# Patient Record
Sex: Female | Born: 1965 | Race: White | Hispanic: No | Marital: Married | Smoking: Never smoker
Health system: Southern US, Community
[De-identification: ages and names within clinical notes are randomized; demographics above are authoritative.]

## PROBLEM LIST (undated history)

## (undated) DIAGNOSIS — D759 Disease of blood and blood-forming organs, unspecified: Secondary | ICD-10-CM

## (undated) HISTORY — PX: TONSILLECTOMY: SUR1361

## (undated) HISTORY — PX: EYE SURGERY: SHX253

---

## 2006-08-11 ENCOUNTER — Other Ambulatory Visit: Admission: RE | Admit: 2006-08-11 | Discharge: 2006-08-11 | Payer: Self-pay | Admitting: Family Medicine

## 2006-08-26 ENCOUNTER — Encounter: Admission: RE | Admit: 2006-08-26 | Discharge: 2006-08-26 | Payer: Self-pay | Admitting: Family Medicine

## 2008-12-06 ENCOUNTER — Encounter: Admission: RE | Admit: 2008-12-06 | Discharge: 2008-12-06 | Payer: Self-pay | Admitting: Obstetrics and Gynecology

## 2009-02-07 ENCOUNTER — Inpatient Hospital Stay (HOSPITAL_COMMUNITY): Admission: AD | Admit: 2009-02-07 | Discharge: 2009-02-09 | Payer: Self-pay | Admitting: Obstetrics and Gynecology

## 2010-10-28 LAB — CBC
HCT: 23.5 % — ABNORMAL LOW (ref 36.0–46.0)
HCT: 29.4 % — ABNORMAL LOW (ref 36.0–46.0)
HCT: 29.9 % — ABNORMAL LOW (ref 36.0–46.0)
Hemoglobin: 7.9 g/dL — CL (ref 12.0–15.0)
Hemoglobin: 9.8 g/dL — ABNORMAL LOW (ref 12.0–15.0)
MCHC: 33.4 g/dL (ref 30.0–36.0)
MCHC: 33.7 g/dL (ref 30.0–36.0)
MCV: 85 fL (ref 78.0–100.0)
Platelets: 202 10*3/uL (ref 150–400)
Platelets: 230 10*3/uL (ref 150–400)
RBC: 2.75 MIL/uL — ABNORMAL LOW (ref 3.87–5.11)
RBC: 3.49 MIL/uL — ABNORMAL LOW (ref 3.87–5.11)
RBC: 3.52 MIL/uL — ABNORMAL LOW (ref 3.87–5.11)
RDW: 13.8 % (ref 11.5–15.5)
RDW: 13.9 % (ref 11.5–15.5)
WBC: 11.8 10*3/uL — ABNORMAL HIGH (ref 4.0–10.5)
WBC: 7.3 10*3/uL (ref 4.0–10.5)

## 2010-10-28 LAB — GLUCOSE, CAPILLARY
Glucose-Capillary: 115 mg/dL — ABNORMAL HIGH (ref 70–99)
Glucose-Capillary: 122 mg/dL — ABNORMAL HIGH (ref 70–99)
Glucose-Capillary: 129 mg/dL — ABNORMAL HIGH (ref 70–99)

## 2013-09-01 ENCOUNTER — Encounter (HOSPITAL_COMMUNITY): Payer: Self-pay

## 2013-09-03 ENCOUNTER — Encounter (HOSPITAL_COMMUNITY): Payer: Self-pay | Admitting: Pharmacist

## 2013-09-13 ENCOUNTER — Encounter (HOSPITAL_COMMUNITY): Payer: Self-pay

## 2013-09-13 ENCOUNTER — Encounter (HOSPITAL_COMMUNITY)
Admission: RE | Admit: 2013-09-13 | Discharge: 2013-09-13 | Disposition: A | Discharge status: Home / Self Care | Payer: Managed Care, Other (non HMO) | Source: Ambulatory Visit | Attending: Obstetrics and Gynecology | Admitting: Obstetrics and Gynecology

## 2013-09-13 DIAGNOSIS — Z01812 Encounter for preprocedural laboratory examination: Secondary | ICD-10-CM | POA: Insufficient documentation

## 2013-09-13 LAB — CBC
HCT: 41.5 % (ref 36.0–46.0)
HEMOGLOBIN: 14.3 g/dL (ref 12.0–15.0)
MCH: 31.2 pg (ref 26.0–34.0)
MCHC: 34.5 g/dL (ref 30.0–36.0)
MCV: 90.6 fL (ref 78.0–100.0)
PLATELETS: 287 10*3/uL (ref 150–400)
RBC: 4.58 MIL/uL (ref 3.87–5.11)
RDW: 12.3 % (ref 11.5–15.5)
WBC: 5.6 10*3/uL (ref 4.0–10.5)

## 2013-09-13 NOTE — Patient Instructions (Signed)
[place]  09/13/2013   Your procedure is scheduled on:  09/20/13  Enter through the Main Entrance of [facility] up the phone at the desk and dial [phone].   Call this number if you have problems the morning of surgery: [phone]   Remember:   Do not eat food:After Midnight.  Do not drink clear liquids: After Midnight.  Take these medicines the morning of surgery with A SIP OF WATER: NA   Do not wear jewelry, make-up or nail polish.  Do not wear lotions, powders, or perfumes. You may wear deodorant.  Do not shave 24 hours prior to surgery.  Do not bring valuables to the hospital.  [facility] is not   responsible for any belongings or valuables brought to the hospital.  Contacts, dentures or bridgework may not be worn into surgery.  Leave suitcase in the car. After surgery it may be brought to your room.  For patients admitted to the hospital, checkout time is 11:00 AM the day of              discharge.   Patients discharged the day of surgery will not be allowed to drive             home.  Name and phone number of your driver: NA  Special Instructions:   Shower using CHG 2 nights before surgery and the night before surgery.  If you shower the day of surgery use CHG.  Use special wash - you have one bottle of CHG for all showers.  You should use approximately 1/3 of the bottle for each shower. Please read over the following fact sheets that you were given:   Surgical Site Infection Prevention

## 2013-09-14 NOTE — H&P (Signed)
  48 year old G 3 P 2 with symptomatic fibroids. She reports very heavy periods and has had anemia and low ferritin.  An ultrasound on 07/28/2013 revealed multiple intramural fibroids  The patient desires LAVH and BSO.  Medical History : Abnormal pap Asthma Anemia  Family History: Mom endometrial cancer  Surgical history : None  Meds Ranitidine Aleve prn Fish oil Iron  NKDA  Afebrile VSS General alert and oriented Lung CTAB Car RRR Abdomen is soft and non tender Pelvic Uterus is slightly enlarged, myomatous   IMPRESSION: Symptomatic Fibroids  PLAN: LAVH and BSO Risks reviewed with patient Consent is signed

## 2013-09-20 ENCOUNTER — Encounter (HOSPITAL_COMMUNITY): Payer: Managed Care, Other (non HMO) | Admitting: Anesthesiology

## 2013-09-20 ENCOUNTER — Observation Stay (HOSPITAL_COMMUNITY)
Admission: RE | Admit: 2013-09-20 | Discharge: 2013-09-21 | Disposition: A | Discharge status: Home / Self Care | Payer: Managed Care, Other (non HMO) | Source: Ambulatory Visit | Attending: Obstetrics and Gynecology | Admitting: Obstetrics and Gynecology

## 2013-09-20 ENCOUNTER — Encounter (HOSPITAL_COMMUNITY): Payer: Self-pay

## 2013-09-20 ENCOUNTER — Encounter (HOSPITAL_COMMUNITY)
Admission: RE | Disposition: A | Discharge status: Home / Self Care | Payer: Self-pay | Source: Ambulatory Visit | Attending: Obstetrics and Gynecology

## 2013-09-20 ENCOUNTER — Ambulatory Visit (HOSPITAL_COMMUNITY): Payer: Managed Care, Other (non HMO) | Admitting: Anesthesiology

## 2013-09-20 DIAGNOSIS — N841 Polyp of cervix uteri: Secondary | ICD-10-CM | POA: Insufficient documentation

## 2013-09-20 DIAGNOSIS — N852 Hypertrophy of uterus: Secondary | ICD-10-CM | POA: Insufficient documentation

## 2013-09-20 DIAGNOSIS — N831 Corpus luteum cyst of ovary, unspecified side: Secondary | ICD-10-CM | POA: Insufficient documentation

## 2013-09-20 DIAGNOSIS — N920 Excessive and frequent menstruation with regular cycle: Principal | ICD-10-CM | POA: Insufficient documentation

## 2013-09-20 DIAGNOSIS — D251 Intramural leiomyoma of uterus: Secondary | ICD-10-CM | POA: Insufficient documentation

## 2013-09-20 DIAGNOSIS — N8300 Follicular cyst of ovary, unspecified side: Secondary | ICD-10-CM | POA: Insufficient documentation

## 2013-09-20 DIAGNOSIS — Z90710 Acquired absence of both cervix and uterus: Secondary | ICD-10-CM | POA: Diagnosis present

## 2013-09-20 HISTORY — DX: Disease of blood and blood-forming organs, unspecified: D75.9

## 2013-09-20 HISTORY — PX: SALPINGOOPHORECTOMY: SHX82

## 2013-09-20 HISTORY — PX: LAPAROSCOPIC ASSISTED VAGINAL HYSTERECTOMY: SHX5398

## 2013-09-20 LAB — BASIC METABOLIC PANEL
BUN: 12 mg/dL (ref 6–23)
CALCIUM: 8.4 mg/dL (ref 8.4–10.5)
CO2: 24 meq/L (ref 19–32)
Chloride: 105 mEq/L (ref 96–112)
Creatinine, Ser: 0.67 mg/dL (ref 0.50–1.10)
GFR calc Af Amer: >90 mL/min (ref >90)
Glucose, Bld: 149 mg/dL — ABNORMAL HIGH (ref 70–99)
Potassium: 4.3 mEq/L (ref 3.7–5.3)
SODIUM: 140 meq/L (ref 137–147)

## 2013-09-20 SURGERY — HYSTERECTOMY, VAGINAL, LAPAROSCOPY-ASSISTED
Anesthesia: General | Site: Abdomen

## 2013-09-20 MED ORDER — ROCURONIUM BROMIDE 100 MG/10ML IV SOLN
INTRAVENOUS | Status: AC
  Filled 2013-09-20: qty 1

## 2013-09-20 MED ORDER — FENTANYL CITRATE 0.05 MG/ML IJ SOLN
INTRAMUSCULAR | Status: AC
  Administered 2013-09-20: 50 ug via INTRAVENOUS
  Filled 2013-09-20: qty 2

## 2013-09-20 MED ORDER — LIDOCAINE HCL (CARDIAC) 20 MG/ML IV SOLN
INTRAVENOUS | Status: DC | PRN
  Administered 2013-09-20: 80 mg via INTRAVENOUS

## 2013-09-20 MED ORDER — FLUTICASONE PROPIONATE 50 MCG/ACT NA SUSP
1.0000 | Freq: Every day | NASAL | Status: DC
  Administered 2013-09-21: 1 via NASAL
  Filled 2013-09-20: qty 16

## 2013-09-20 MED ORDER — LIDOCAINE HCL (CARDIAC) 20 MG/ML IV SOLN
INTRAVENOUS | Status: AC
  Filled 2013-09-20: qty 5

## 2013-09-20 MED ORDER — ROCURONIUM BROMIDE 100 MG/10ML IV SOLN
INTRAVENOUS | Status: DC | PRN
  Administered 2013-09-20: 5 mg via INTRAVENOUS
  Administered 2013-09-20: 45 mg via INTRAVENOUS

## 2013-09-20 MED ORDER — PROMETHAZINE HCL 25 MG/ML IJ SOLN
6.2500 mg | INTRAMUSCULAR | Status: AC | PRN
  Administered 2013-09-20 (×2): 12.5 mg via INTRAVENOUS

## 2013-09-20 MED ORDER — DEXAMETHASONE SODIUM PHOSPHATE 10 MG/ML IJ SOLN
INTRAMUSCULAR | Status: AC
  Filled 2013-09-20: qty 1

## 2013-09-20 MED ORDER — KETOROLAC TROMETHAMINE 30 MG/ML IJ SOLN: 15.0000 mg | Freq: Once | INTRAMUSCULAR | Status: DC | PRN

## 2013-09-20 MED ORDER — FENTANYL CITRATE 0.05 MG/ML IJ SOLN
25.0000 ug | INTRAMUSCULAR | Status: DC | PRN
  Administered 2013-09-20 (×3): 50 ug via INTRAVENOUS

## 2013-09-20 MED ORDER — METOCLOPRAMIDE HCL 5 MG/ML IJ SOLN
10.0000 mg | Freq: Once | INTRAMUSCULAR | Status: AC
  Administered 2013-09-20: 10 mg via INTRAVENOUS

## 2013-09-20 MED ORDER — IBUPROFEN 600 MG PO TABS
600.0000 mg | ORAL_TABLET | Freq: Four times a day (QID) | ORAL | Status: DC | PRN
  Administered 2013-09-21 (×3): 600 mg via ORAL
  Filled 2013-09-20 (×3): qty 1

## 2013-09-20 MED ORDER — GLYCOPYRROLATE 0.2 MG/ML IJ SOLN
INTRAMUSCULAR | Status: AC
  Filled 2013-09-20: qty 3

## 2013-09-20 MED ORDER — MEPERIDINE HCL 25 MG/ML IJ SOLN: 6.2500 mg | INTRAMUSCULAR | Status: DC | PRN

## 2013-09-20 MED ORDER — ONDANSETRON HCL 4 MG/2ML IJ SOLN
INTRAMUSCULAR | Status: AC
  Administered 2013-09-20: 4 mg via INTRAVENOUS
  Filled 2013-09-20: qty 2

## 2013-09-20 MED ORDER — DEXAMETHASONE SODIUM PHOSPHATE 10 MG/ML IJ SOLN
INTRAMUSCULAR | Status: DC | PRN
  Administered 2013-09-20: 10 mg via INTRAVENOUS

## 2013-09-20 MED ORDER — PROPOFOL 10 MG/ML IV BOLUS
INTRAVENOUS | Status: DC | PRN
  Administered 2013-09-20: 150 mg via INTRAVENOUS
  Administered 2013-09-20: 50 mg via INTRAVENOUS

## 2013-09-20 MED ORDER — DIPHENHYDRAMINE HCL 50 MG/ML IJ SOLN: 12.5000 mg | Freq: Four times a day (QID) | INTRAMUSCULAR | Status: DC | PRN

## 2013-09-20 MED ORDER — LACTATED RINGERS IR SOLN
Status: DC | PRN
  Administered 2013-09-20: 3000 mL

## 2013-09-20 MED ORDER — CEFAZOLIN SODIUM-DEXTROSE 2-3 GM-% IV SOLR
INTRAVENOUS | Status: AC
  Administered 2013-09-20: 2 g via INTRAVENOUS
  Filled 2013-09-20: qty 50

## 2013-09-20 MED ORDER — SODIUM CHLORIDE 0.9 % IJ SOLN
INTRAMUSCULAR | Status: DC | PRN
  Administered 2013-09-20: 3 mL via INTRAVENOUS

## 2013-09-20 MED ORDER — SCOPOLAMINE 1 MG/3DAYS TD PT72
MEDICATED_PATCH | TRANSDERMAL | Status: AC
  Administered 2013-09-20: 1.5 mg via TRANSDERMAL
  Filled 2013-09-20: qty 1

## 2013-09-20 MED ORDER — GLYCOPYRROLATE 0.2 MG/ML IJ SOLN
INTRAMUSCULAR | Status: DC | PRN
  Administered 2013-09-20: .5 mg via INTRAVENOUS

## 2013-09-20 MED ORDER — LORATADINE 10 MG PO TABS
10.0000 mg | ORAL_TABLET | Freq: Every day | ORAL | Status: DC | PRN
  Filled 2013-09-20: qty 1

## 2013-09-20 MED ORDER — MENTHOL 3 MG MT LOZG
1.0000 | LOZENGE | OROMUCOSAL | Status: DC | PRN
  Administered 2013-09-20: 3 mg via ORAL
  Filled 2013-09-20: qty 9

## 2013-09-20 MED ORDER — DIPHENHYDRAMINE HCL 12.5 MG/5ML PO ELIX: 12.5000 mg | ORAL_SOLUTION | Freq: Four times a day (QID) | ORAL | Status: DC | PRN

## 2013-09-20 MED ORDER — ONDANSETRON HCL 4 MG/2ML IJ SOLN
4.0000 mg | Freq: Once | INTRAMUSCULAR | Status: AC | PRN
  Administered 2013-09-20: 4 mg via INTRAVENOUS

## 2013-09-20 MED ORDER — NEOSTIGMINE METHYLSULFATE 1 MG/ML IJ SOLN
INTRAMUSCULAR | Status: DC | PRN
  Administered 2013-09-20: 3 mg via INTRAVENOUS

## 2013-09-20 MED ORDER — NEOSTIGMINE METHYLSULFATE 1 MG/ML IJ SOLN
INTRAMUSCULAR | Status: AC
  Filled 2013-09-20: qty 1

## 2013-09-20 MED ORDER — ONDANSETRON HCL 4 MG/2ML IJ SOLN
INTRAMUSCULAR | Status: AC
  Filled 2013-09-20: qty 2

## 2013-09-20 MED ORDER — PROPOFOL 10 MG/ML IV EMUL
INTRAVENOUS | Status: AC
  Filled 2013-09-20: qty 20

## 2013-09-20 MED ORDER — SODIUM CHLORIDE 0.9 % IJ SOLN: 9.0000 mL | INTRAMUSCULAR | Status: DC | PRN

## 2013-09-20 MED ORDER — MIDAZOLAM HCL 2 MG/2ML IJ SOLN
INTRAMUSCULAR | Status: AC
  Filled 2013-09-20: qty 2

## 2013-09-20 MED ORDER — BUPIVACAINE HCL (PF) 0.25 % IJ SOLN
INTRAMUSCULAR | Status: AC
  Filled 2013-09-20: qty 30

## 2013-09-20 MED ORDER — BUPIVACAINE HCL (PF) 0.25 % IJ SOLN
INTRAMUSCULAR | Status: DC | PRN
  Administered 2013-09-20: 6 mL

## 2013-09-20 MED ORDER — ESTRADIOL 0.1 MG/GM VA CREA
TOPICAL_CREAM | VAGINAL | Status: AC
  Filled 2013-09-20: qty 42.5

## 2013-09-20 MED ORDER — MIDAZOLAM HCL 2 MG/2ML IJ SOLN
INTRAMUSCULAR | Status: DC | PRN
  Administered 2013-09-20: 2 mg via INTRAVENOUS

## 2013-09-20 MED ORDER — SODIUM CHLORIDE 0.9 % IJ SOLN
INTRAMUSCULAR | Status: AC
  Filled 2013-09-20: qty 10

## 2013-09-20 MED ORDER — TRAMADOL HCL 50 MG PO TABS
50.0000 mg | ORAL_TABLET | Freq: Four times a day (QID) | ORAL | Status: DC | PRN
  Administered 2013-09-20 – 2013-09-21 (×3): 50 mg via ORAL
  Filled 2013-09-20 (×3): qty 1

## 2013-09-20 MED ORDER — ONDANSETRON HCL 4 MG/2ML IJ SOLN
INTRAMUSCULAR | Status: DC | PRN
  Administered 2013-09-20: 4 mg via INTRAVENOUS

## 2013-09-20 MED ORDER — PROMETHAZINE HCL 25 MG/ML IJ SOLN
INTRAMUSCULAR | Status: AC
Start: 2013-09-20 — End: 2013-09-20
  Administered 2013-09-20: 12.5 mg via INTRAVENOUS
  Filled 2013-09-20: qty 1

## 2013-09-20 MED ORDER — CEFAZOLIN SODIUM-DEXTROSE 2-3 GM-% IV SOLR: 2.0000 g | INTRAVENOUS | Status: DC

## 2013-09-20 MED ORDER — LACTATED RINGERS IV SOLN
INTRAVENOUS | Status: DC
  Administered 2013-09-20: 10:00:00 via INTRAVENOUS
  Administered 2013-09-20: 125 mL/h via INTRAVENOUS
  Administered 2013-09-20: 08:00:00 via INTRAVENOUS

## 2013-09-20 MED ORDER — FENTANYL CITRATE 0.05 MG/ML IJ SOLN
INTRAMUSCULAR | Status: DC | PRN
  Administered 2013-09-20: 100 ug via INTRAVENOUS
  Administered 2013-09-20 (×3): 50 ug via INTRAVENOUS

## 2013-09-20 MED ORDER — METOCLOPRAMIDE HCL 5 MG/ML IJ SOLN
INTRAMUSCULAR | Status: AC
  Filled 2013-09-20: qty 2

## 2013-09-20 MED ORDER — LIDOCAINE 1%/NA BICARB 0.1 MEQ INJECTION
INJECTION | INTRAVENOUS | Status: AC
  Filled 2013-09-20: qty 1

## 2013-09-20 MED ORDER — DEXTROSE IN LACTATED RINGERS 5 % IV SOLN
INTRAVENOUS | Status: DC
  Administered 2013-09-20: 14:00:00 via INTRAVENOUS

## 2013-09-20 MED ORDER — HYDROMORPHONE 0.3 MG/ML IV SOLN
INTRAVENOUS | Status: DC
  Administered 2013-09-20: 3 mL via INTRAVENOUS
  Administered 2013-09-20: 11:00:00 via INTRAVENOUS
  Administered 2013-09-20: 3.59 mg via INTRAVENOUS
  Administered 2013-09-20: 6 mL via INTRAVENOUS
  Filled 2013-09-20: qty 25

## 2013-09-20 MED ORDER — NALOXONE HCL 0.4 MG/ML IJ SOLN: 0.4000 mg | INTRAMUSCULAR | Status: DC | PRN

## 2013-09-20 MED ORDER — FENTANYL CITRATE 0.05 MG/ML IJ SOLN
INTRAMUSCULAR | Status: AC
  Filled 2013-09-20: qty 5

## 2013-09-20 MED ORDER — SCOPOLAMINE 1 MG/3DAYS TD PT72
1.0000 | MEDICATED_PATCH | TRANSDERMAL | Status: DC
  Administered 2013-09-20: 1.5 mg via TRANSDERMAL

## 2013-09-20 MED ORDER — ONDANSETRON HCL 4 MG/2ML IJ SOLN
4.0000 mg | Freq: Four times a day (QID) | INTRAMUSCULAR | Status: DC | PRN
  Administered 2013-09-20: 4 mg via INTRAVENOUS
  Filled 2013-09-20: qty 2

## 2013-09-20 SURGICAL SUPPLY — 39 items
ADH SKN CLS APL DERMABOND .7 (GAUZE/BANDAGES/DRESSINGS) ×2
BARRIER ADHS 3X4 INTERCEED (GAUZE/BANDAGES/DRESSINGS) IMPLANT
BRR ADH 4X3 ABS CNTRL BYND (GAUZE/BANDAGES/DRESSINGS)
CABLE HIGH FREQUENCY MONO STRZ (ELECTRODE) IMPLANT
CHLORAPREP W/TINT 26ML (MISCELLANEOUS) ×4 IMPLANT
CLOTH BEACON ORANGE TIMEOUT ST (SAFETY) ×4 IMPLANT
CONT PATH 16OZ SNAP LID 3702 (MISCELLANEOUS) ×4 IMPLANT
COVER TABLE BACK 60X90 (DRAPES) ×4 IMPLANT
DECANTER SPIKE VIAL GLASS SM (MISCELLANEOUS) IMPLANT
DERMABOND ADVANCED (GAUZE/BANDAGES/DRESSINGS) ×2
DERMABOND ADVANCED .7 DNX12 (GAUZE/BANDAGES/DRESSINGS) ×2 IMPLANT
ELECT REM PT RETURN 9FT ADLT (ELECTROSURGICAL) ×4
ELECTRODE REM PT RTRN 9FT ADLT (ELECTROSURGICAL) ×2 IMPLANT
EVACUATOR SMOKE 8.L (FILTER) IMPLANT
GLOVE BIO SURGEON STRL SZ 6.5 (GLOVE) ×3 IMPLANT
GLOVE BIO SURGEONS STRL SZ 6.5 (GLOVE) ×1
GLOVE BIOGEL PI IND STRL 6.5 (GLOVE) ×2 IMPLANT
GLOVE BIOGEL PI INDICATOR 6.5 (GLOVE) ×2
GOWN STRL REUS W/TWL LRG LVL3 (GOWN DISPOSABLE) ×16 IMPLANT
NEEDLE INSUFFLATION 120MM (ENDOMECHANICALS) ×4 IMPLANT
NS IRRIG 1000ML POUR BTL (IV SOLUTION) ×4 IMPLANT
PACK LAVH (CUSTOM PROCEDURE TRAY) ×4 IMPLANT
PROTECTOR NERVE ULNAR (MISCELLANEOUS) ×4 IMPLANT
SEALER TISSUE G2 CVD JAW 45CM (ENDOMECHANICALS) ×4 IMPLANT
SET IRRIG TUBING LAPAROSCOPIC (IRRIGATION / IRRIGATOR) IMPLANT
SOLUTION ELECTROLUBE (MISCELLANEOUS) IMPLANT
SUT VIC AB 0 CT1 18XCR BRD8 (SUTURE) ×4 IMPLANT
SUT VIC AB 0 CT1 36 (SUTURE) ×12 IMPLANT
SUT VIC AB 0 CT1 8-18 (SUTURE) ×8
SUT VIC AB 3-0 PS2 18 (SUTURE)
SUT VIC AB 3-0 PS2 18XBRD (SUTURE) IMPLANT
SUT VICRYL 0 TIES 12 18 (SUTURE) ×4 IMPLANT
SUT VICRYL 0 UR6 27IN ABS (SUTURE) ×4 IMPLANT
TOWEL OR 17X24 6PK STRL BLUE (TOWEL DISPOSABLE) ×8 IMPLANT
TRAY FOLEY CATH 14FR (SET/KITS/TRAYS/PACK) ×4 IMPLANT
TROCAR OPTI TIP 5M 100M (ENDOMECHANICALS) ×4 IMPLANT
TROCAR XCEL DIL TIP R 11M (ENDOMECHANICALS) ×4 IMPLANT
WARMER LAPAROSCOPE (MISCELLANEOUS) ×4 IMPLANT
WATER STERILE IRR 1000ML POUR (IV SOLUTION) ×4 IMPLANT

## 2013-09-20 NOTE — Anesthesia Postprocedure Evaluation (Signed)
  Anesthesia Post-op Note  Anesthesia Post Note  Patient: Tina Wells  Procedure(s) Performed: Procedure(s) (LRB): LAPAROSCOPIC ASSISTED VAGINAL HYSTERECTOMY (N/A) SALPINGO OOPHORECTOMY (Bilateral)  Anesthesia type: General  Patient location: PACU  Post pain: Pain level controlled  Post assessment: Post-op Vital signs reviewed  Last Vitals:  Filed Vitals:   09/20/13 1015  BP: 100/69  Pulse: 68  Temp: 36.6 C  Resp: 15    Post vital signs: Reviewed  Level of consciousness: sedated  Complications: No apparent anesthesia complications

## 2013-09-20 NOTE — Anesthesia Postprocedure Evaluation (Signed)
Anesthesia Post Note  Patient: Tina Wells  Procedure(s) Performed: Procedure(s): LAPAROSCOPIC ASSISTED VAGINAL HYSTERECTOMY (N/A) SALPINGO OOPHORECTOMY (Bilateral)  Anesthesia type: General  Patient location: [facility] Unit  Post pain: Pain level controlled  Post assessment: Post-op Vital signs reviewed  Last Vitals: BP 105/59  Pulse 76  Temp(Src) 36.3 C (Oral)  Resp 9  Ht 5\' 4"  (1.626 m)  Wt 132 lb (59.875 kg)  BMI 22.65 kg/m2  SpO2 100%  Post vital signs: Reviewed  Level of consciousness: awake  Complications: No apparent anesthesia complications

## 2013-09-20 NOTE — Addendum Note (Signed)
Addendum created 09/20/13 1431 by Flossie Dibble, CRNA   Modules edited: Notes Section   Notes Section:  File: [id number]

## 2013-09-20 NOTE — Progress Notes (Signed)
History and physical on the chart. No significant changes Will proceed with LAVH and BSO Consent signed. 

## 2013-09-20 NOTE — Transfer of Care (Signed)
Immediate Anesthesia Transfer of Care Note  Patient: Tina Wells  Procedure(s) Performed: Procedure(s): LAPAROSCOPIC ASSISTED VAGINAL HYSTERECTOMY (N/A) SALPINGO OOPHORECTOMY (Bilateral)  Patient Location: PACU  Anesthesia Type:General  Level of Consciousness: awake, alert  and oriented  Airway & Oxygen Therapy: Patient Spontanous Breathing and Patient connected to nasal cannula oxygen  Post-op Assessment: Report given to PACU RN, Post -op Vital signs reviewed and stable and Patient moving all extremities  Post vital signs: Reviewed and stable  Complications: No apparent anesthesia complications

## 2013-09-20 NOTE — Anesthesia Preprocedure Evaluation (Addendum)
Anesthesia Evaluation  Patient identified by MRN, date of birth, ID band Patient awake    Reviewed: Allergy & Precautions, H&P , NPO status , Patient's Chart, lab work & pertinent test results  Airway Mallampati: I TM Distance: >3 FB Neck ROM: full    Dental no notable dental hx. (+) Teeth Intact   Pulmonary neg pulmonary ROS,    Pulmonary exam normal       Cardiovascular negative cardio ROS      Neuro/Psych negative neurological ROS  negative psych ROS   GI/Hepatic negative GI ROS, Neg liver ROS,   Endo/Other  negative endocrine ROS  Renal/GU negative Renal ROS  negative genitourinary   Musculoskeletal negative musculoskeletal ROS (+)   Abdominal Normal abdominal exam  (+)   Peds  Hematology   Anesthesia Other Findings   Reproductive/Obstetrics negative OB ROS                          Anesthesia Physical Anesthesia Plan  ASA: II  Anesthesia Plan: General   Post-op Pain Management:    Induction: Intravenous  Airway Management Planned: Oral ETT  Additional Equipment:   Intra-op Plan:   Post-operative Plan: Extubation in OR  Informed Consent: I have reviewed the patients History and Physical, chart, labs and discussed the procedure including the risks, benefits and alternatives for the proposed anesthesia with the patient or authorized representative who has indicated his/her understanding and acceptance.   Dental Advisory Given  Plan Discussed with: CRNA, Anesthesiologist and Surgeon  Anesthesia Plan Comments:        Anesthesia Quick Evaluation

## 2013-09-20 NOTE — Brief Op Note (Signed)
09/20/2013  9:03 AM  PATIENT:  Tina Wells  48 y.o. female  PRE-OPERATIVE DIAGNOSIS:  MENORRHAGIA,FIBROIDS  POST-OPERATIVE DIAGNOSIS:  MENORRHAGIA,FIBROIDS  PROCEDURE:  Procedure(s): LAPAROSCOPIC ASSISTED VAGINAL HYSTERECTOMY (N/A) SALPINGO OOPHORECTOMY (Bilateral)  SURGEON:  Surgeon(s) and Role:    * Cyril Mourning, MD - Primary    * Linda Hedges, DO - Assisting  PHYSICIAN ASSISTANT:   ASSISTANTS: none   ANESTHESIA:   general  EBL:  Total I/O In: 1000 [I.V.:1000] Out: 200 [Urine:100; Blood:100]  BLOOD ADMINISTERED:none  DRAINS: Urinary Catheter (Foley)   LOCAL MEDICATIONS USED:  LIDOCAINE   SPECIMEN:  Source of Specimen:  ovaries, tubes, cervix and uterus  DISPOSITION OF SPECIMEN:  PATHOLOGY  COUNTS:  YES  TOURNIQUET:  * No tourniquets in log *  DICTATION: .Other Dictation: Dictation Number [id number]  PLAN OF CARE: Admit for overnight observation  PATIENT DISPOSITION:  PACU - hemodynamically stable.   Delay start of Pharmacological VTE agent (>24hrs) due to surgical blood loss or risk of bleeding: not applicable

## 2013-09-20 NOTE — Progress Notes (Signed)
Patient is doing well Some pain but is sleeping well. Afebrile vss Urine output is normal Abdomen is soft flat and non tender Incision is clean and dry Impression: Post op  Doing well Routine care Discussed with patient

## 2013-09-20 NOTE — Op Note (Signed)
NAMEPIETRA, Tina Wells              ACCOUNT NO.:  [id number]  MEDICAL RECORD NO.:  [id number]  LOCATION:  [facility]                          FACILITY:  [place]  PHYSICIAN:  Jerae Izard L. Jhett Fretwell, M.D.DATE OF BIRTH:  [birth year]  DATE OF PROCEDURE:  09/20/2013 DATE OF DISCHARGE:                              OPERATIVE REPORT   PREOPERATIVE DIAGNOSIS:  Menorrhagia and symptomatic fibroids.  POSTOPERATIVE DIAGNOSIS:  Menorrhagia and symptomatic fibroids.  PROCEDURE:  LAVH and BSO.  SURGEON:  Melvinia Ashby L. Helane Rima, MD  ANESTHESIA:  General.  EBL:  100 mL.  COMPLICATIONS:  None.  DRAINS:  Foley catheter.  PATHOLOGY:  Uterus, cervix, tubes, and ovaries.  DESCRIPTION OF PROCEDURE:  The patient was taken to the operating room. She was intubated.  She was prepped and draped in usual sterile fashion. Time-out was performed.  A uterine manipulator was inserted.  A Foley catheter was inserted and draining clear fluid.  Attention was turned to the abdomen where a small infraumbilical incision was made.  The Veress needle was inserted.  Pneumoperitoneum was performed.  The Veress needle was then removed.  An 11 mm trocar was inserted.  The patient was placed in Trendelenburg position, with insertion of the laparoscope, there was no area of intestinal injury or hemorrhage noted.  The uterus was inspected and noted to be myomatous and the adnexal were normal.  We then placed a suprapubic port suprapubically under direct visualization. We then first used the EnSeal instrument for cautery and then I cauterized the round ligament on both sides to help with hemostasis. The adnexa were normal.  The patient desired to have her adnexal removed.  So, I first grasped the right fallopian tube and ovary, identified the ureter which was well below our field, placed the EnSeal across the IP ligament with careful attention to stay away from the sidewall and the ureter and carefully cauterized this and cut this  and carried it down to the round ligament.  This was done on the right side, and on the left side in identical fashion with excellent hemostasis. There was no bleeding noted.  At this point, we then went down to the vagina, after we released the pneumoperitoneum, placed a weighted speculum in the vagina, made a circumferential around the cervix with the Bovie in standard fashion, entered the cul-de-sac easily posteriorly with Mayo scissors and entered the anterior cul-de-sac easily using Metzenbaum scissors.  We then placed a series of curved Heaney clamps staying snug beside the cervix and uterus initially clamping the cervix, the uterosacral and cardinal ligaments on either side.  Each pedicle was clamped, cut, and suture ligated using 0 Vicryl suture.  We walked our way up the broad ligament in standard fashion.  Each pedicle was very hemostatic.  We then retroflexed the uterus to remove the specimen and clamped the remainder of the broad ligament on either side.  The specimen was removed and identified as cervix, uterus, fallopian tubes, and ovaries and sent to Pathology.  The remainder of the pedicles were secured using a suture ligature of 0 Vicryl suture.  We then inspected our pedicles.  Hemostasis was very good.  I then closed the posterior cuff using 0  Vicryl and running locked stitch.  Hemostasis was again very good and then I closed the cuff completely anterior to posterior in a running locked stitch using 0 Vicryl suture.  At this point, our sponge, lap, and instrument counts were correct x2.  Her hemostasis was excellent.  Urine output was normal and was clear urine.  I then went back up to the abdomen, re-insufflated with a CO2, and inserted the [place].  I placed the patient in Trendelenburg again and for brief period of time irrigated the pelvis.  All pedicles were inspected for period of time and hemostasis was very good.  After irrigation was performed, hemostasis was  noted.  I then released the pneumoperitoneum, inspected the pelvis.  Hemostasis was very good under low pressure.  We released as much pneumoperitoneum as we could.  We removed the trocars and closed infraumbilical incision using 3-0 Vicryl interrupted and Dermabond was placed at both sites.  All sponge, lap, and instrument counts were correct x2.  The patient was extubated, went to recovery room in stable condition.     Dakisha Schoof L. Helane Rima, M.D.     Nevin Bloodgood  D:  09/20/2013  T:  09/20/2013  Job:  [id number]

## 2013-09-20 NOTE — Anesthesia Procedure Notes (Signed)
Procedure Name: Intubation Date/Time: 09/20/2013 7:33 AM Performed by: Flossie Dibble Pre-anesthesia Checklist: Patient identified, Emergency Drugs available, Timeout performed, Patient being monitored and Suction available Patient Re-evaluated:Patient Re-evaluated prior to inductionOxygen Delivery Method: Circle system utilized Preoxygenation: Pre-oxygenation with 100% oxygen Intubation Type: IV induction Ventilation: Mask ventilation without difficulty Laryngoscope Size: Mac and 3 Grade View: Grade I Tube type: Oral Tube size: 7.0 mm Number of attempts: 1 Airway Equipment and Method: Stylet Placement Confirmation: ETT inserted through vocal cords under direct vision,  breath sounds checked- equal and bilateral and positive ETCO2 Secured at: 21 cm Tube secured with: Tape Dental Injury: Teeth and Oropharynx as per pre-operative assessment

## 2013-09-21 ENCOUNTER — Encounter (HOSPITAL_COMMUNITY): Payer: Self-pay | Admitting: Obstetrics and Gynecology

## 2013-09-21 MED ORDER — IBUPROFEN 600 MG PO TABS: 600.0000 mg | ORAL_TABLET | Freq: Four times a day (QID) | ORAL | Status: AC | PRN

## 2013-09-21 MED ORDER — TRAMADOL HCL 50 MG PO TABS: 50.0000 mg | ORAL_TABLET | Freq: Four times a day (QID) | ORAL | Status: AC | PRN

## 2013-09-21 MED ORDER — OXYCODONE-ACETAMINOPHEN 10-325 MG PO TABS: 1.0000 | ORAL_TABLET | ORAL | Status: AC | PRN

## 2013-09-21 NOTE — Progress Notes (Signed)
1 Day Post-Op Procedure(s) (LRB): LAPAROSCOPIC ASSISTED VAGINAL HYSTERECTOMY (N/A) SALPINGO OOPHORECTOMY (Bilateral)  Subjective: Patient reports tolerating PO.    Objective: I have reviewed patient's vital signs, intake and output and medications.  General: alert, cooperative and appears stated age Vaginal Bleeding: none Abdomen is soft and non tender and non distended  Assessment: s/p Procedure(s): LAPAROSCOPIC ASSISTED VAGINAL HYSTERECTOMY (N/A) SALPINGO OOPHORECTOMY (Bilateral): stable, progressing well and tolerating diet  Plan: Advance diet Encourage ambulation Discharge home  LOS: 1 day    Tina Wells L 09/21/2013, 8:00 AM

## 2013-09-21 NOTE — Progress Notes (Signed)
Pt out in wheelchair  Teaching complete   

## 2013-09-21 NOTE — Discharge Summary (Signed)
Admission Diagnosis: Symptomatic Fibroids  Discharge Diagnosis: Same  Hospital Course: 48 year old female with symptomatic fibroids. She underwent LAVH and BSO which was uncomplicated. By POD #1 she was ambulating, tolerating regular diet and had stable vital signs. She was discharged home in good condition. She will be discharged home with percocet, ibuprofen and ultram. She was given discharge precautions. She was advised no driving for 1 week. She will follow up in 1 week.

## 2014-04-19 ENCOUNTER — Other Ambulatory Visit: Payer: Self-pay | Admitting: Obstetrics and Gynecology

## 2014-04-19 DIAGNOSIS — N6310 Unspecified lump in the right breast, unspecified quadrant: Secondary | ICD-10-CM

## 2014-04-20 ENCOUNTER — Other Ambulatory Visit: Payer: Self-pay | Admitting: Obstetrics and Gynecology

## 2014-04-20 ENCOUNTER — Ambulatory Visit
Admission: RE | Admit: 2014-04-20 | Discharge: 2014-04-20 | Disposition: A | Discharge status: Home / Self Care | Payer: Managed Care, Other (non HMO) | Source: Ambulatory Visit | Attending: Obstetrics and Gynecology | Admitting: Obstetrics and Gynecology

## 2014-04-20 DIAGNOSIS — N6310 Unspecified lump in the right breast, unspecified quadrant: Secondary | ICD-10-CM

## 2014-04-21 ENCOUNTER — Other Ambulatory Visit: Payer: Self-pay | Admitting: Obstetrics and Gynecology

## 2014-04-21 DIAGNOSIS — C50911 Malignant neoplasm of unspecified site of right female breast: Secondary | ICD-10-CM

## 2014-04-21 DIAGNOSIS — D0511 Intraductal carcinoma in situ of right breast: Secondary | ICD-10-CM

## 2014-04-22 ENCOUNTER — Telehealth: Payer: Self-pay | Admitting: *Deleted

## 2014-04-22 DIAGNOSIS — C50511 Malignant neoplasm of lower-outer quadrant of right female breast: Secondary | ICD-10-CM | POA: Insufficient documentation

## 2014-04-22 NOTE — Telephone Encounter (Signed)
Confirmed [facility] for 04/27/14 at 0800 .  Instructions and contact information given.

## 2014-04-25 ENCOUNTER — Other Ambulatory Visit: Payer: Self-pay | Admitting: Obstetrics and Gynecology

## 2014-04-25 ENCOUNTER — Ambulatory Visit
Admission: RE | Admit: 2014-04-25 | Discharge: 2014-04-25 | Disposition: A | Discharge status: Home / Self Care | Payer: Managed Care, Other (non HMO) | Source: Ambulatory Visit | Attending: Obstetrics and Gynecology | Admitting: Obstetrics and Gynecology

## 2014-04-25 ENCOUNTER — Other Ambulatory Visit: Payer: Managed Care, Other (non HMO)

## 2014-04-25 DIAGNOSIS — N6489 Other specified disorders of breast: Secondary | ICD-10-CM

## 2014-04-25 DIAGNOSIS — D0511 Intraductal carcinoma in situ of right breast: Secondary | ICD-10-CM

## 2014-04-25 DIAGNOSIS — R928 Other abnormal and inconclusive findings on diagnostic imaging of breast: Secondary | ICD-10-CM

## 2014-04-25 DIAGNOSIS — C50911 Malignant neoplasm of unspecified site of right female breast: Secondary | ICD-10-CM

## 2014-04-25 MED ORDER — GADOBENATE DIMEGLUMINE 529 MG/ML IV SOLN
11.0000 mL | Freq: Once | INTRAVENOUS | Status: AC | PRN
  Administered 2014-04-25: 11 mL via INTRAVENOUS

## 2014-04-26 ENCOUNTER — Telehealth: Payer: Self-pay | Admitting: *Deleted

## 2014-04-26 NOTE — Telephone Encounter (Signed)
Pt called to cancel her [place] on 04/27/14. She is going to [facility] for an initial consultation. Informed pt that she is welcome to come back to our [facility] if she like. Received verbal understanding. Contact information given.

## 2014-04-27 ENCOUNTER — Other Ambulatory Visit: Payer: Managed Care, Other (non HMO)

## 2014-04-27 ENCOUNTER — Ambulatory Visit: Payer: Managed Care, Other (non HMO)

## 2014-04-27 ENCOUNTER — Ambulatory Visit: Payer: Managed Care, Other (non HMO) | Admitting: Radiation Oncology

## 2014-04-27 ENCOUNTER — Ambulatory Visit: Payer: Managed Care, Other (non HMO) | Admitting: Hematology and Oncology

## 2014-04-27 ENCOUNTER — Ambulatory Visit: Payer: Managed Care, Other (non HMO) | Admitting: Physical Therapy

## 2014-04-29 ENCOUNTER — Other Ambulatory Visit: Payer: Managed Care, Other (non HMO)

## 2015-03-29 ENCOUNTER — Other Ambulatory Visit: Payer: Self-pay | Admitting: Obstetrics and Gynecology

## 2015-03-30 LAB — CYTOLOGY - PAP

## 2016-02-08 IMAGING — MG MM DIGITAL DIAGNOSTIC UNILAT*L*
2 series · 2 of 2 positions shown · non-contrast
Comparison: With prior mammograms, and with bilateral breast at the
time performed 04/25/2014.

CLINICAL DATA: Patient was recently diagnosed with right breast
cancer. Left mammogram is being performed 4 correlation with breast
MRI and for evaluation prior to treatment of the right breast
cancer.

EXAM:
DIGITAL DIAGNOSTIC  LEFT MAMMOGRAM WITH CAD

[L CC]
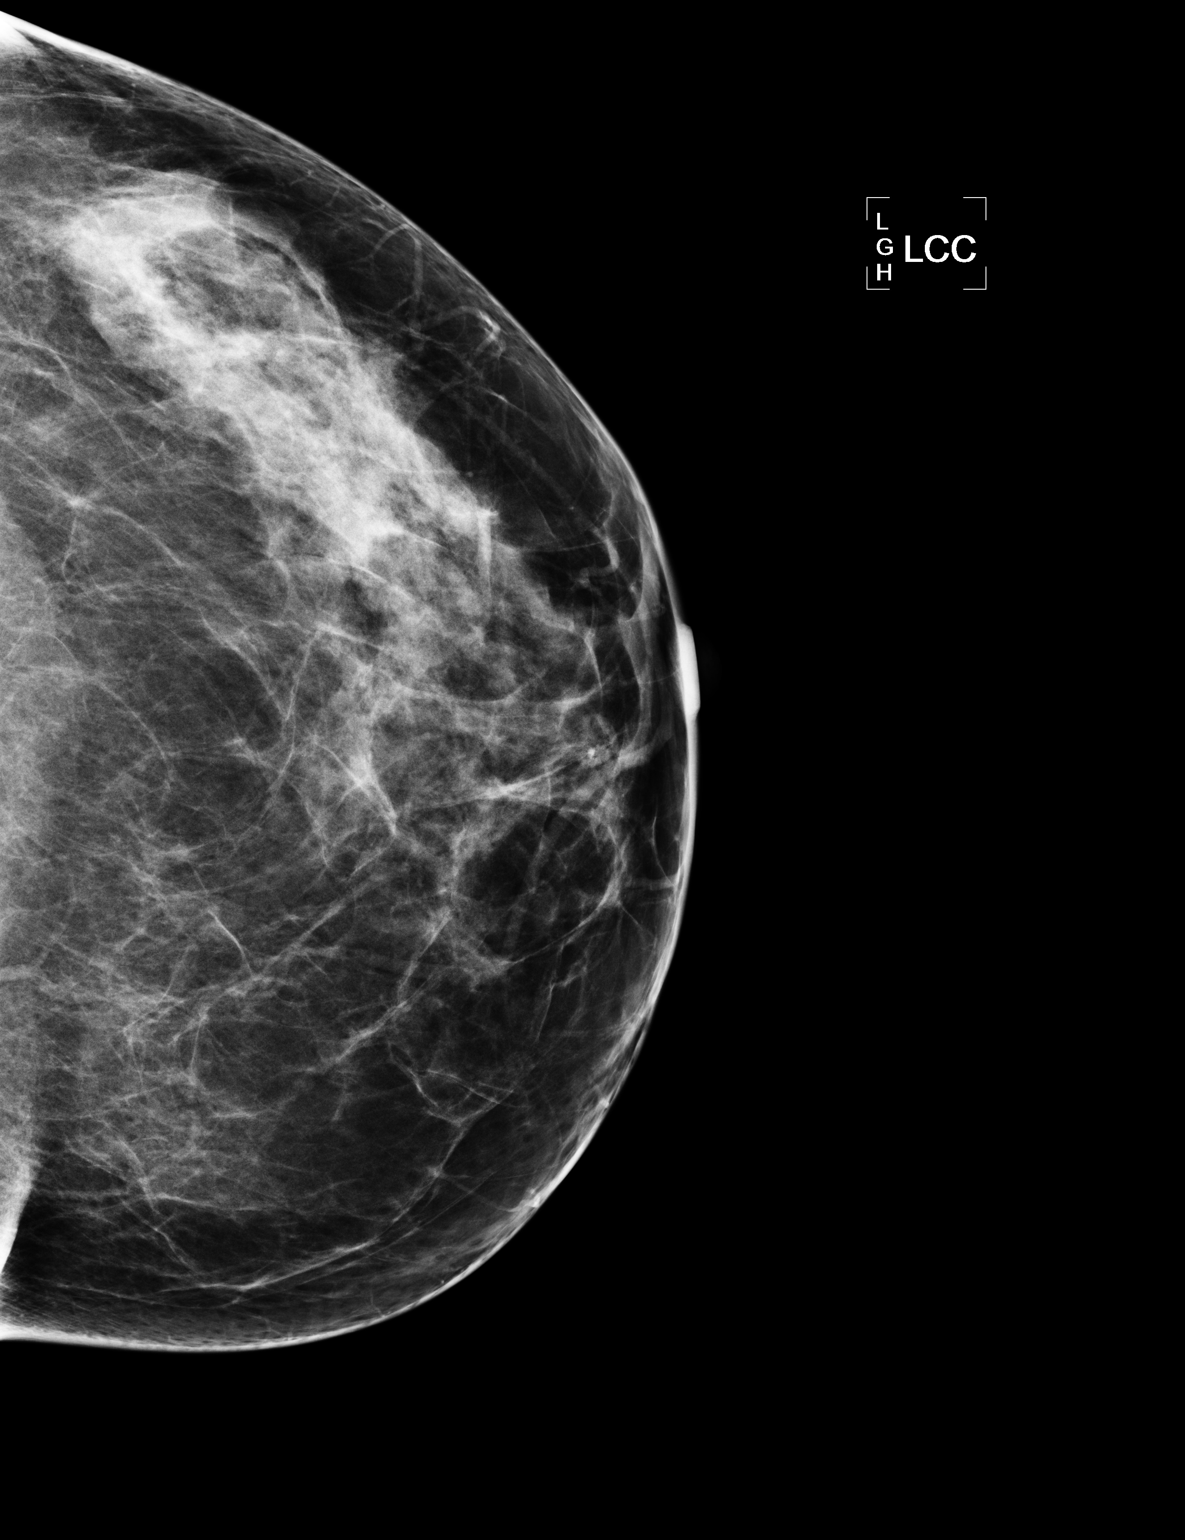

[L MLO]
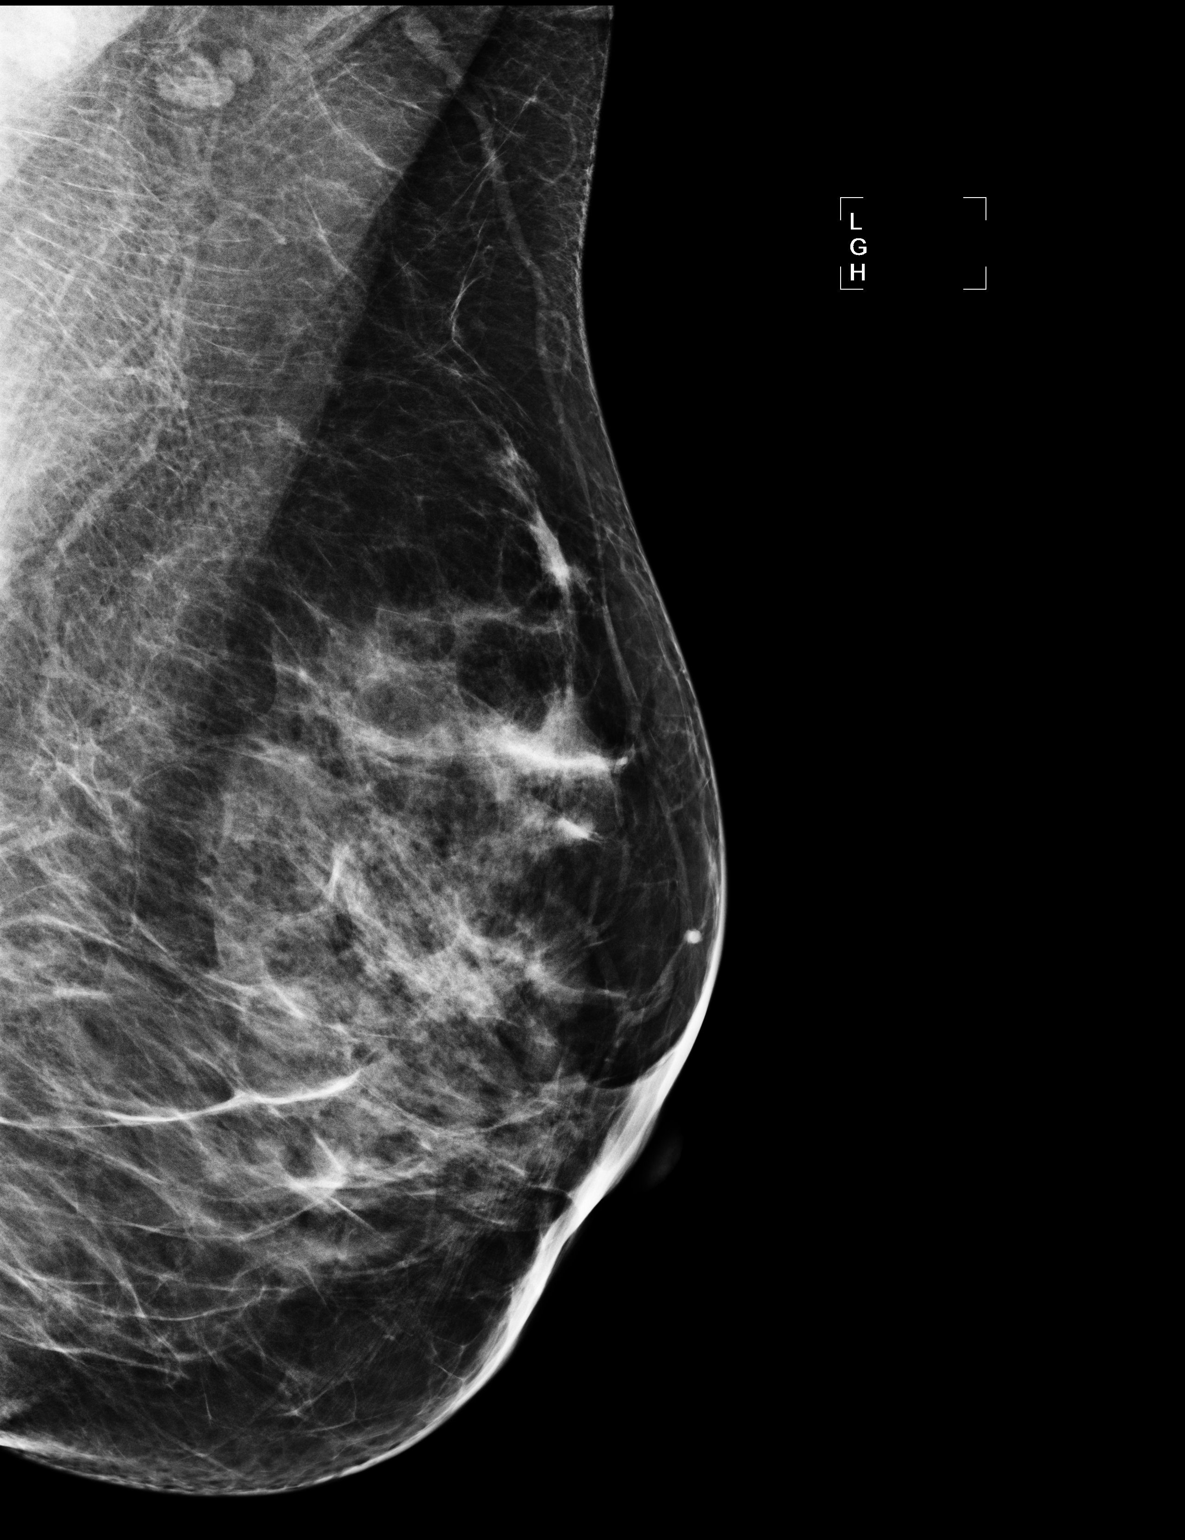

[2 of 2 positions shown; findings below may reference images not displayed]

ACR Breast Density Category c: The breast tissue is heterogeneously
dense, which may obscure small masses.
FINDINGS: No mass, distortion, or suspicious microcalcification is identified
in the left breast to suggest malignancy.

Mammographic images were processed with CAD.
IMPRESSION: No evidence of malignancy in the left breast.

RECOMMENDATION:
Annual mammography is recommended.

I have discussed the findings and recommendations with the patient.
Results were also provided in writing at the conclusion of the
visit. If applicable, a reminder letter will be sent to the patient
regarding the next appointment.

BI-RADS CATEGORY  1: Negative.
